# Patient Record
Sex: Female | Born: 1990 | Race: White | Hispanic: No | Marital: Married | Smoking: Former smoker
Health system: Southern US, Community
[De-identification: ages and names within clinical notes are randomized; demographics above are authoritative.]

## PROBLEM LIST (undated history)

## (undated) DIAGNOSIS — Z789 Other specified health status: Secondary | ICD-10-CM

## (undated) HISTORY — DX: Other specified health status: Z78.9

## (undated) HISTORY — PX: CHOLECYSTECTOMY: SHX55

---
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
## 2008-01-31 ENCOUNTER — Emergency Department: Payer: Self-pay | Admitting: Emergency Medicine

## 2008-02-07 ENCOUNTER — Emergency Department: Payer: Self-pay | Admitting: Emergency Medicine

## 2008-12-06 IMAGING — CT CT ABD-PELV W/ CM
1 of 2 series · 15 of 32 positions shown, 19 images · non-contrast
Comparison: none

REASON FOR EXAM: (1) rlq pain; (2) rlq pin
COMMENTS:

PROCEDURE:     CT  - CT ABDOMEN / PELVIS  W  - August 10, 2007  [DATE]
RESULT:     Comparison: Renal stone CT on 07/13/2007.
TECHNIQUE: CT examination of the abdomen and pelvis was performed after
intravenous administration of 85 ml of Ysovue-HRC nonionic contrast in
addition to oral contrast. Collimation is 3 mm.

[Series 2: appendicitis · axial · 0.76mm/px · z∈[-416,+16]mm · 15 of 158 slices shown, 19 images]
[im 7/158  soft-tissue]
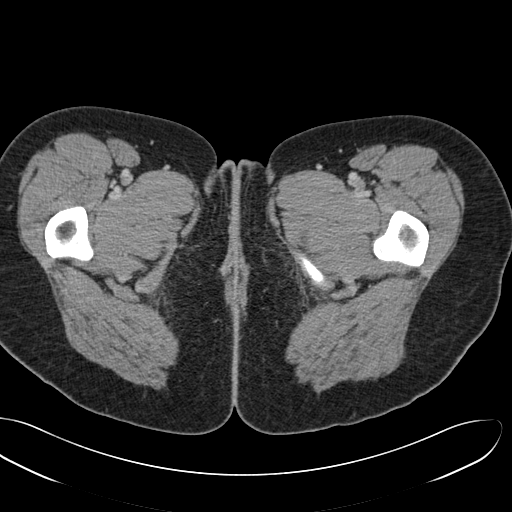
[im 7/158  bone]
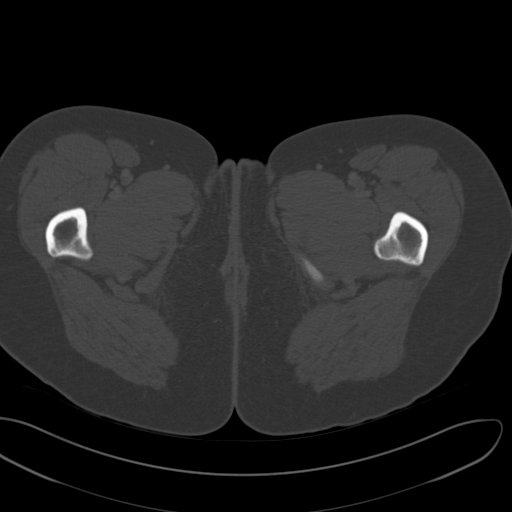
[im 19/158  soft-tissue]
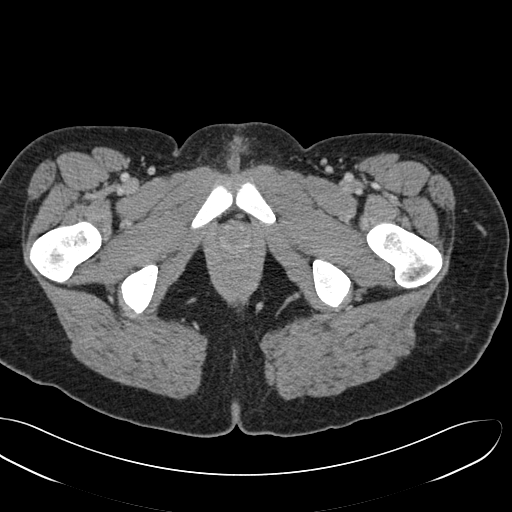
[im 32/158  soft-tissue]
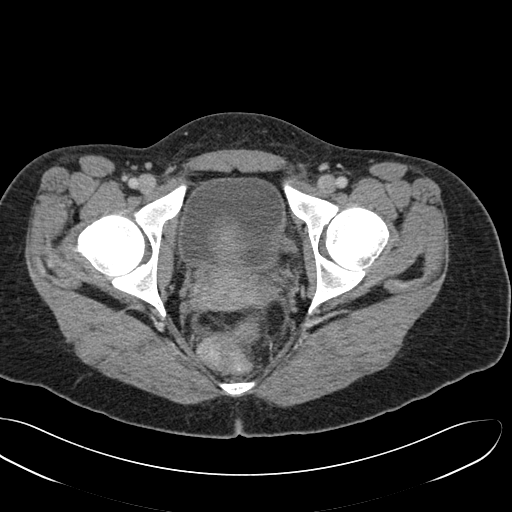
[im 44/158  soft-tissue]
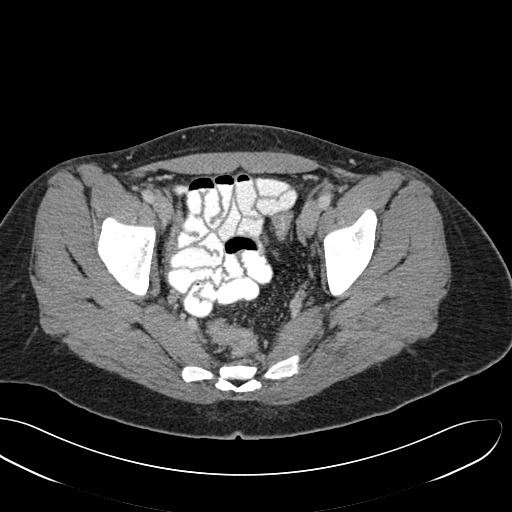
[im 57/158  soft-tissue]
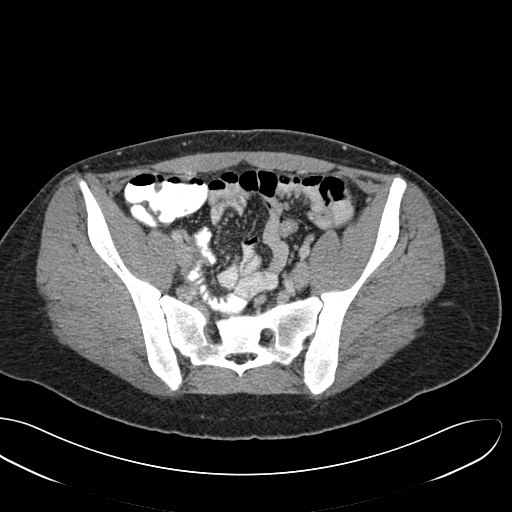
[im 70/158  soft-tissue]
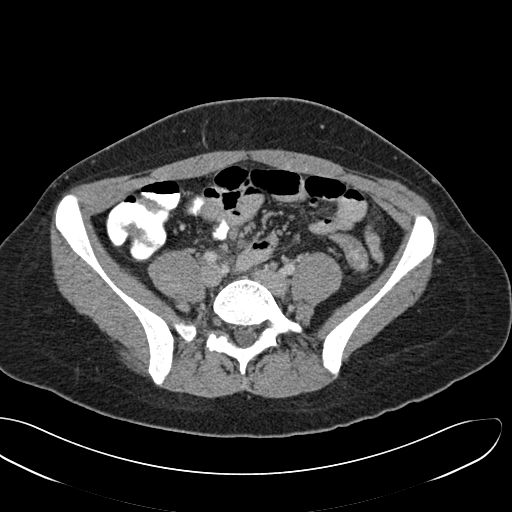
[im 82/158  soft-tissue]
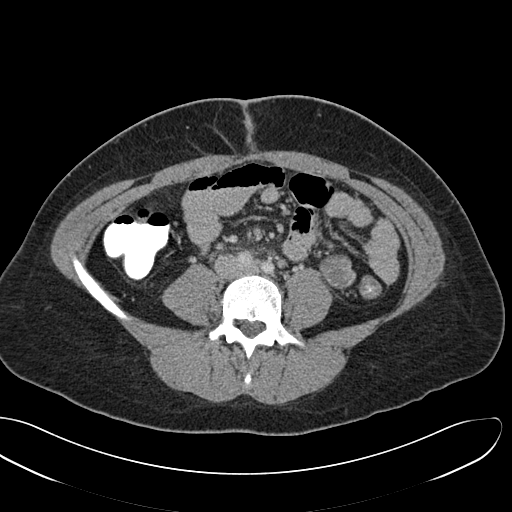
[im 88/158  soft-tissue]
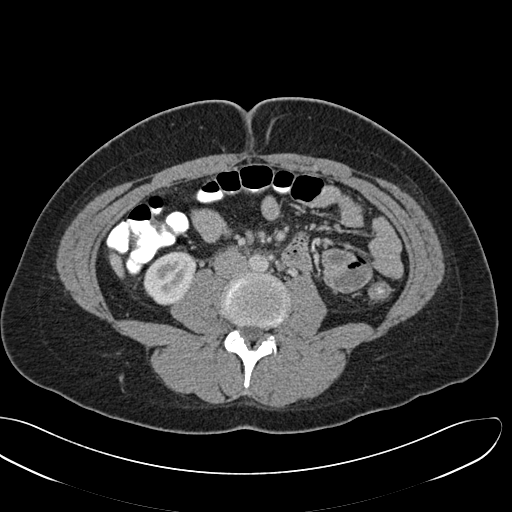
[im 101/158  soft-tissue]
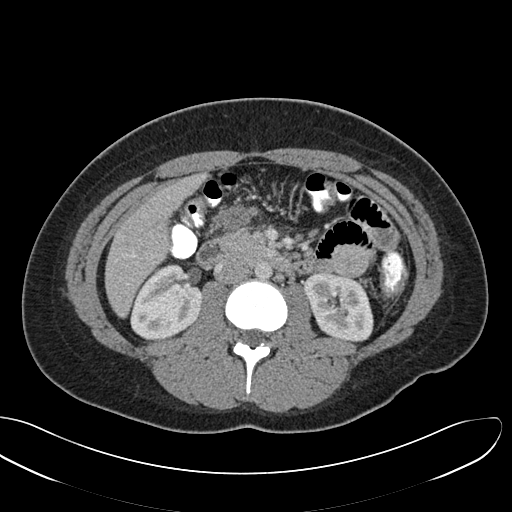
[im 101/158  bone]
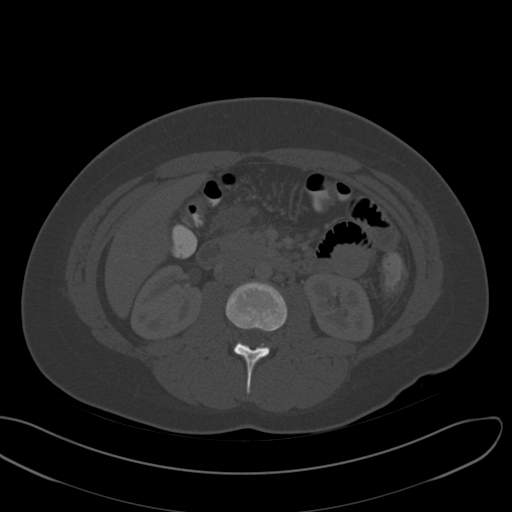
[im 114/158  soft-tissue]
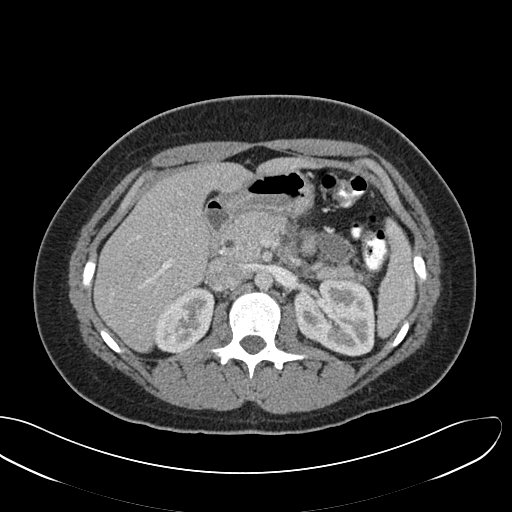
[im 126/158  soft-tissue]
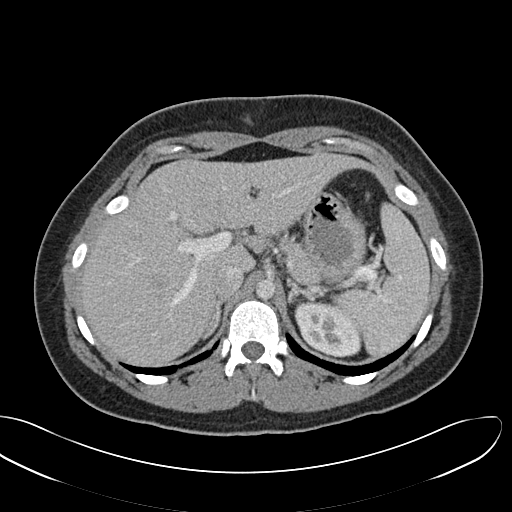
[im 132/158  lung]
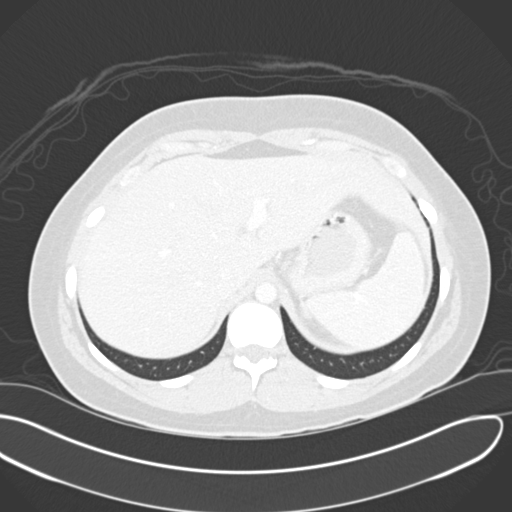
[im 139/158  soft-tissue]
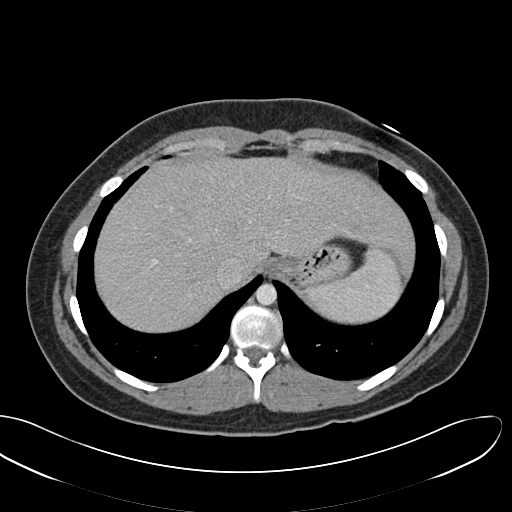
[im 139/158  lung]
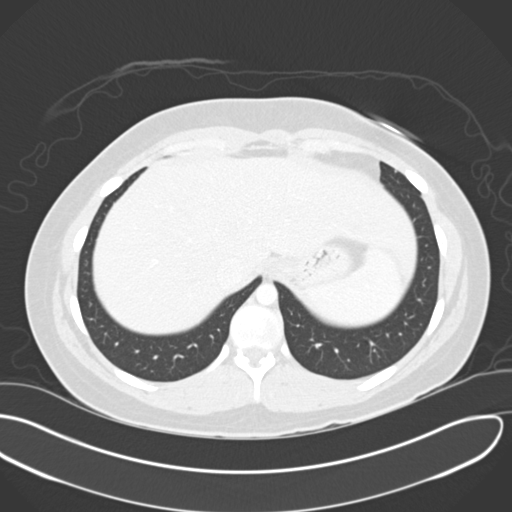
[im 145/158  lung]
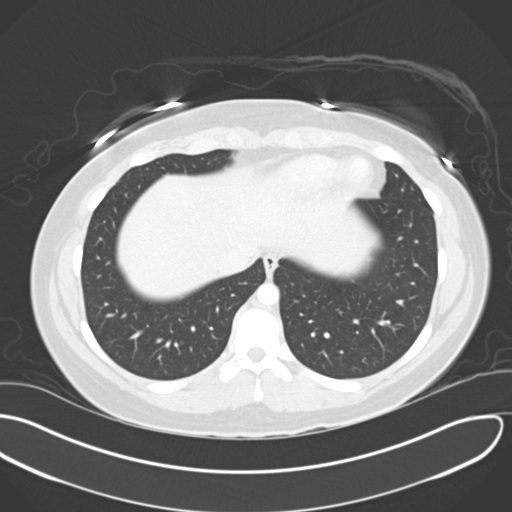
[im 151/158  soft-tissue]
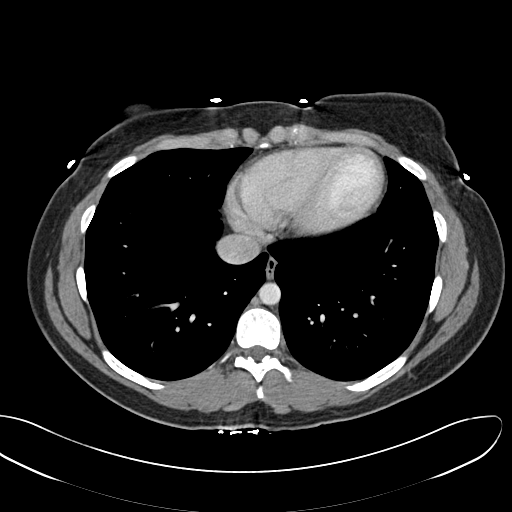
[im 151/158  lung]
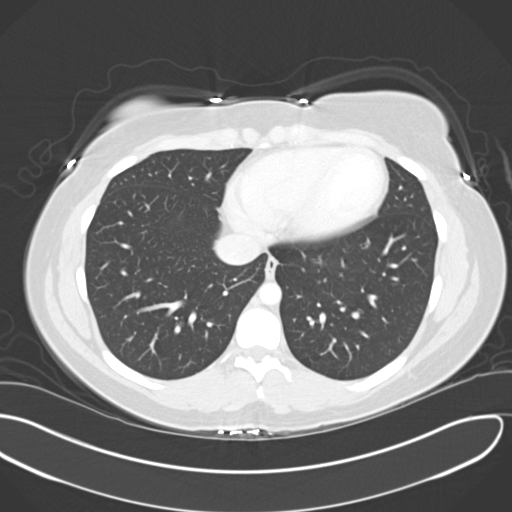

[15 of 32 positions shown; findings below may reference images not displayed]

FINDINGS: Limited evaluation of the lung bases is unremarkable.

Peripancreatic stranding is decreased compare to the previous exam. There is
no well-defined fluid collection to suggest a formed pseudocyst. The portal
and splenic veins are patent. There is no focal area of decreased pancreatic
enhancement to suggest pancreatic necrosis. The liver, spleen, adrenal
glands, and kidneys are unremarkable. Patient is status post
cholecystectomy.

There is no dilatation or definite wall thickening of the bowels. The
appendix is unremarkable. There is no intraperitoneal free air. There
minimal residual pelvic free fluid. There are no enlarged abdominal pelvic
lymph nodes. The uterus is present with fluid in the uterine cavity.
IMPRESSION: 1. Findings are again suggestive of sequela of pancreatitis with decreased
peripancreatic inflammation compare to prior.

## 2008-12-06 IMAGING — US US PELV - US TRANSVAGINAL
1 series · 17 of 25 positions shown · non-contrast
Comparison: none

REASON FOR EXAM: RLQ pain
COMMENTS:

[Series 1: us pelv - us transvaginal · 17 of 35 slices shown]
[im 1/35]
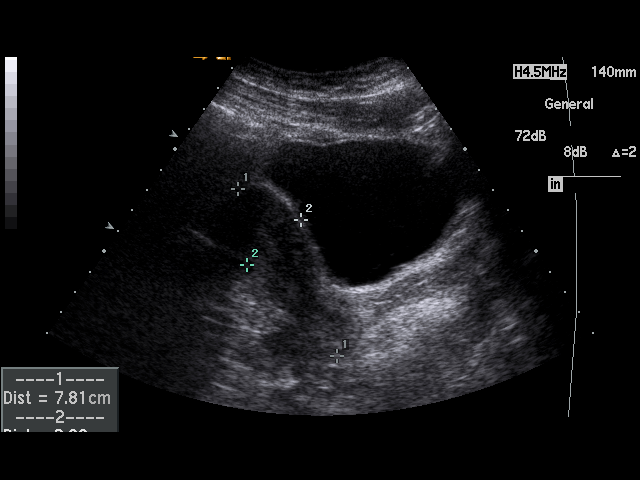
[im 3/35]
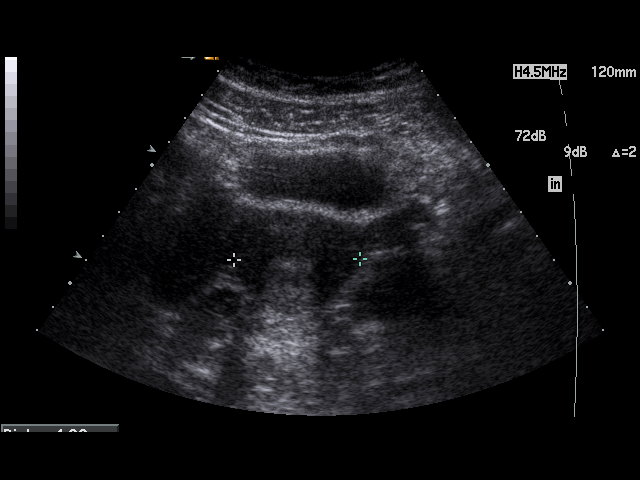
[im 5/35]
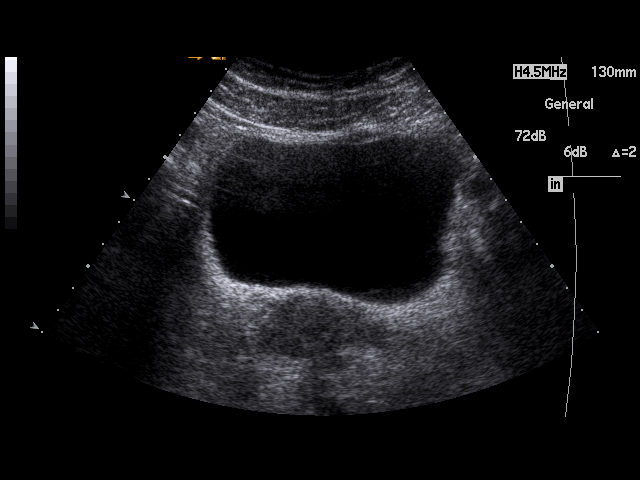
[im 8/35]
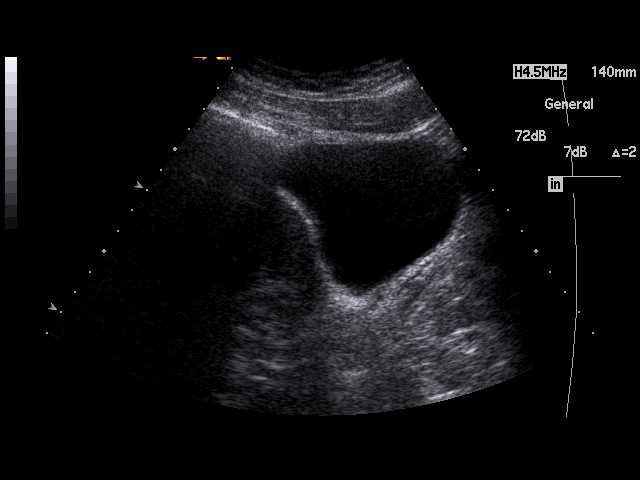
[im 9/35]
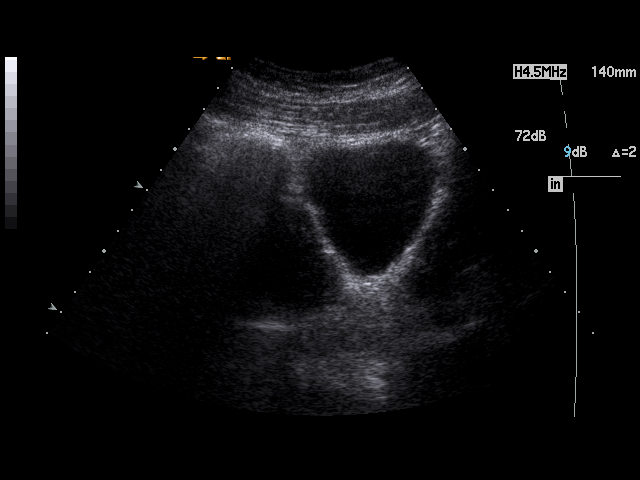
[im 12/35]
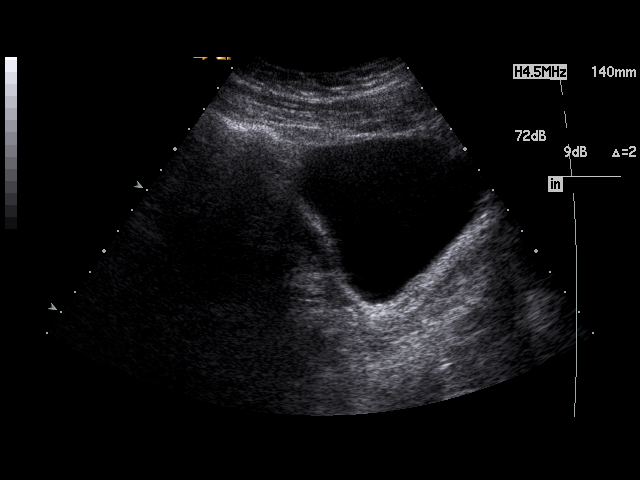
[im 13/35]
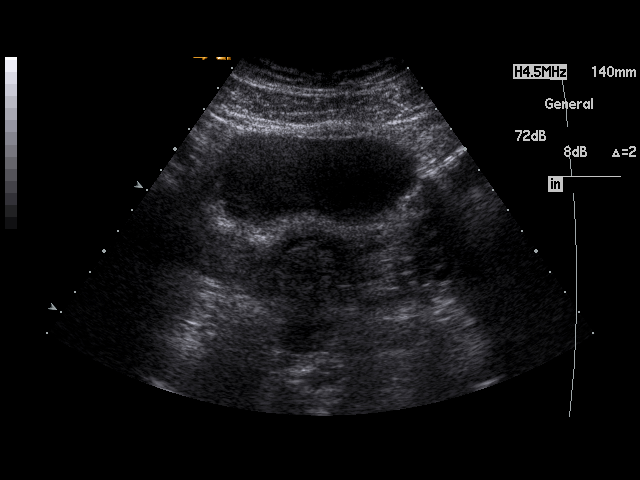
[im 16/35]
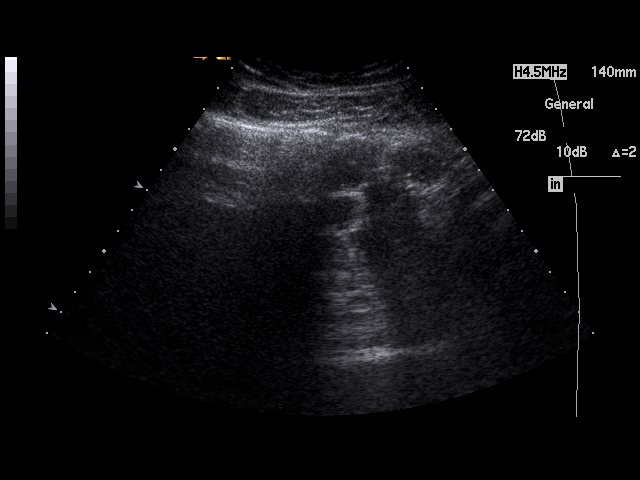
[im 18/35]
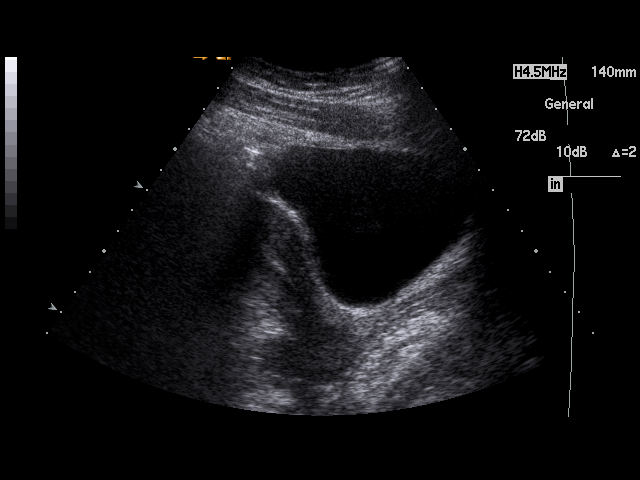
[im 19/35]
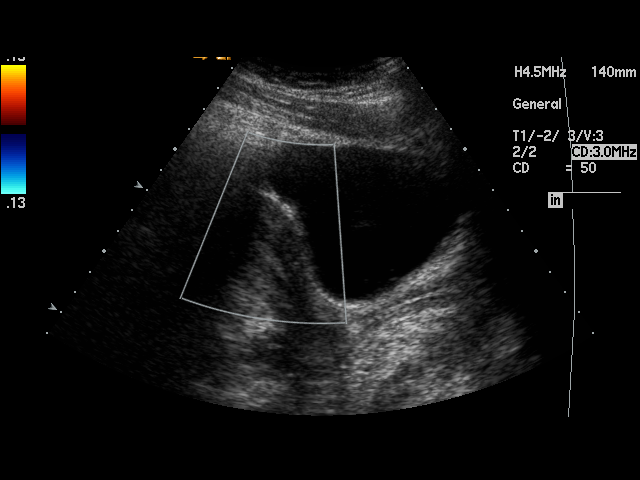
[im 22/35]
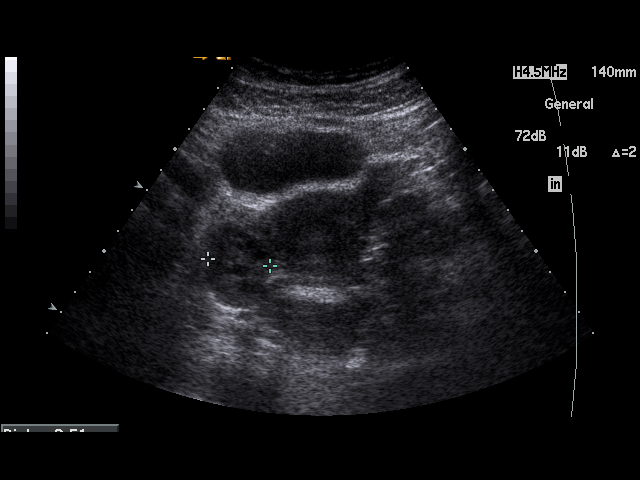
[im 23/35]
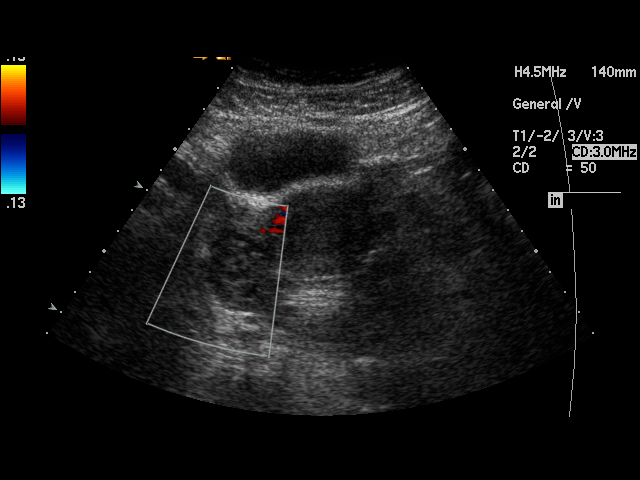
[im 26/35]
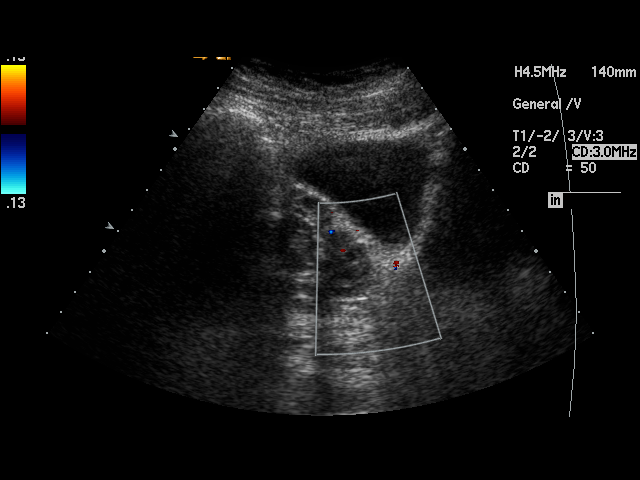
[im 27/35]
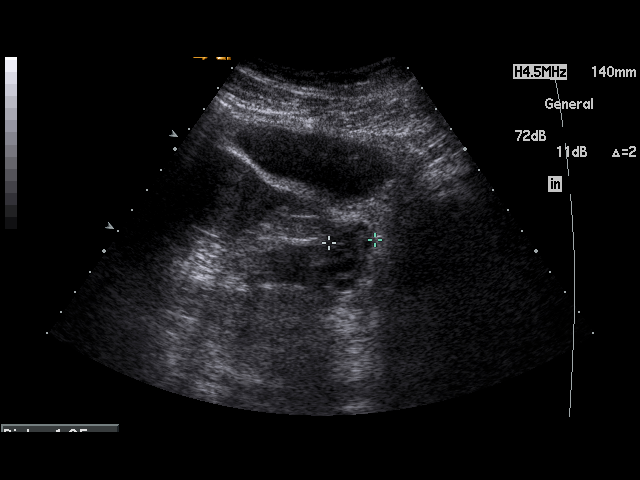
[im 30/35]
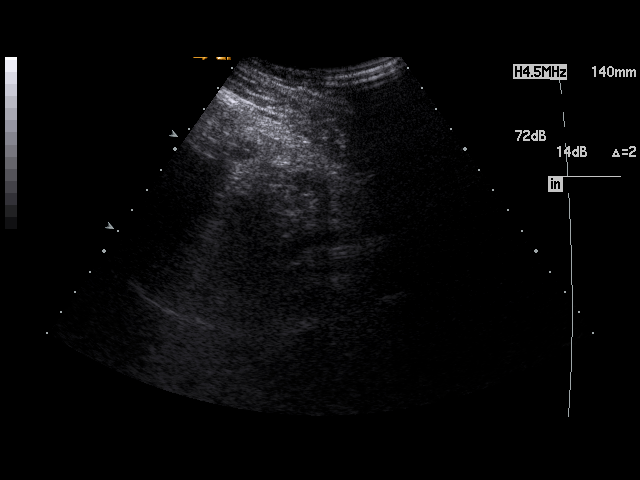
[im 32/35]
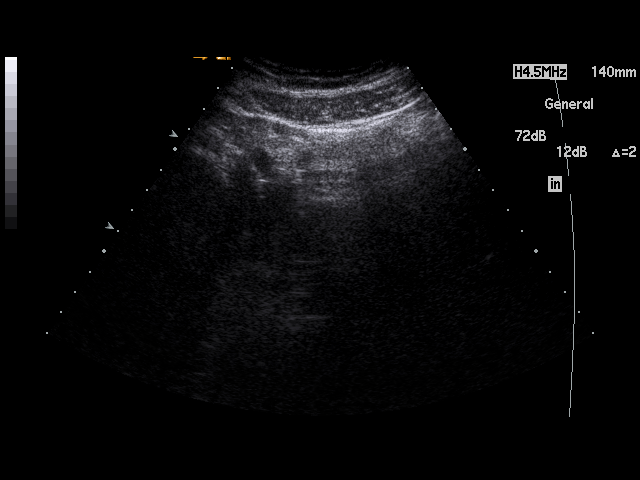
[im 35/35]
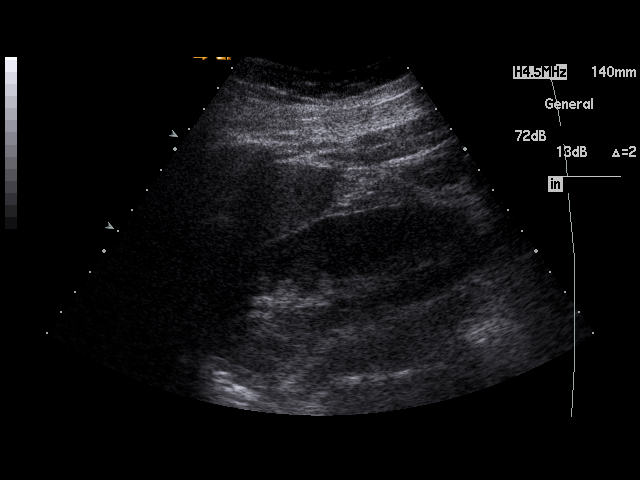

[17 of 25 positions shown; findings below may reference images not displayed]

PROCEDURE:     US  - US PELVIS MASS EXAM  - [DATE] [DATE] [DATE]  [DATE]

RESULT:     The size, shape and echotexture of the uterus are normal. The
adnexa are normal. No pathologic pelvic fluid collections are noted. There
is no hydronephrosis. The patient refused endovaginal exam. A negative
Pelvic Ultrasound does not exclude ectopic or early IUP.
IMPRESSION: Normal exam.

## 2010-12-09 ENCOUNTER — Emergency Department: Payer: Self-pay | Admitting: Emergency Medicine

## 2015-05-19 ENCOUNTER — Encounter: Payer: Self-pay | Admitting: Emergency Medicine

## 2015-05-19 ENCOUNTER — Emergency Department
Admission: EM | Admit: 2015-05-19 | Discharge: 2015-05-19 | Disposition: A | Discharge status: Home / Self Care | Payer: BC Managed Care – PPO | Attending: Emergency Medicine | Admitting: Emergency Medicine

## 2015-05-19 DIAGNOSIS — Z87891 Personal history of nicotine dependence: Secondary | ICD-10-CM | POA: Diagnosis not present

## 2015-05-19 DIAGNOSIS — R05 Cough: Secondary | ICD-10-CM | POA: Diagnosis present

## 2015-05-19 DIAGNOSIS — J069 Acute upper respiratory infection, unspecified: Secondary | ICD-10-CM | POA: Insufficient documentation

## 2015-05-19 MED ORDER — HYDROCOD POLST-CPM POLST ER 10-8 MG/5ML PO SUER: 5.0000 mL | Freq: Two times a day (BID) | ORAL | Status: DC | PRN

## 2015-05-19 MED ORDER — AMOXICILLIN 875 MG PO TABS: 875.0000 mg | ORAL_TABLET | Freq: Two times a day (BID) | ORAL | Status: DC

## 2015-05-19 NOTE — Discharge Instructions (Signed)
Upper Respiratory Infection, Adult Most upper respiratory infections (URIs) are a viral infection of the air passages leading to the lungs. A URI affects the nose, throat, and upper air passages. The most common type of URI is nasopharyngitis and is typically referred to as "the common cold." URIs run their course and usually go away on their own. Most of the time, a URI does not require medical attention, but sometimes a bacterial infection in the upper airways can follow a viral infection. This is called a secondary infection. Sinus and middle ear infections are common types of secondary upper respiratory infections. Bacterial pneumonia can also complicate a URI. A URI can worsen asthma and chronic obstructive pulmonary disease (COPD). Sometimes, these complications can require emergency medical care and may be life threatening.  CAUSES Almost all URIs are caused by viruses. A virus is a type of germ and can spread from one person to another.  RISKS FACTORS You may be at risk for a URI if:   You smoke.   You have chronic heart or lung disease.  You have a weakened defense (immune) system.   You are very young or very old.   You have nasal allergies or asthma.  You work in crowded or poorly ventilated areas.  You work in health care facilities or schools. SIGNS AND SYMPTOMS  Symptoms typically develop 2-3 days after you come in contact with a cold virus. Most viral URIs last 7-10 days. However, viral URIs from the influenza virus (flu virus) can last 14-18 days and are typically more severe. Symptoms may include:   Runny or stuffy (congested) nose.   Sneezing.   Cough.   Sore throat.   Headache.   Fatigue.   Fever.   Loss of appetite.   Pain in your forehead, behind your eyes, and over your cheekbones (sinus pain).  Muscle aches.  DIAGNOSIS  Your health care provider may diagnose a URI by:  Physical exam.  Tests to check that your symptoms are not due to  another condition such as:  Strep throat.  Sinusitis.  Pneumonia.  Asthma. TREATMENT  A URI goes away on its own with time. It cannot be cured with medicines, but medicines may be prescribed or recommended to relieve symptoms. Medicines may help:  Reduce your fever.  Reduce your cough.  Relieve nasal congestion. HOME CARE INSTRUCTIONS   Take medicines only as directed by your health care provider.   Gargle warm saltwater or take cough drops to comfort your throat as directed by your health care provider.  Use a warm mist humidifier or inhale steam from a shower to increase air moisture. This may make it easier to breathe.  Drink enough fluid to keep your urine clear or pale yellow.   Eat soups and other clear broths and maintain good nutrition.   Rest as needed.   Return to work when your temperature has returned to normal or as your health care provider advises. You may need to stay home longer to avoid infecting others. You can also use a face mask and careful hand washing to prevent spread of the virus.  Increase the usage of your inhaler if you have asthma.   Do not use any tobacco products, including cigarettes, chewing tobacco, or electronic cigarettes. If you need help quitting, ask your health care provider. PREVENTION  The best way to protect yourself from getting a cold is to practice good hygiene.   Avoid oral or hand contact with people with cold   symptoms.   Wash your hands often if contact occurs.  There is no clear evidence that vitamin C, vitamin E, echinacea, or exercise reduces the chance of developing a cold. However, it is always recommended to get plenty of rest, exercise, and practice good nutrition.  SEEK MEDICAL CARE IF:   You are getting worse rather than better.   Your symptoms are not controlled by medicine.   You have chills.  You have worsening shortness of breath.  You have brown or red mucus.  You have yellow or brown nasal  discharge.  You have pain in your face, especially when you bend forward.  You have a fever.  You have swollen neck glands.  You have pain while swallowing.  You have white areas in the back of your throat. SEEK IMMEDIATE MEDICAL CARE IF:   You have severe or persistent:  Headache.  Ear pain.  Sinus pain.  Chest pain.  You have chronic lung disease and any of the following:  Wheezing.  Prolonged cough.  Coughing up blood.  A change in your usual mucus.  You have a stiff neck.  You have changes in your:  Vision.  Hearing.  Thinking.  Mood. MAKE SURE YOU:   Understand these instructions.  Will watch your condition.  Will get help right away if you are not doing well or get worse.   This information is not intended to replace advice given to you by your health care provider. Make sure you discuss any questions you have with your health care provider.   Document Released: 10/22/2000 Document Revised: 09/12/2014 Document Reviewed: 08/03/2013 Elsevier Interactive Patient Education 2016 [employer]  

## 2015-05-19 NOTE — ED Provider Notes (Signed)
[facility] Emergency Department Provider Note  ____________________________________________  Time seen: Approximately 8:58 AM  I have reviewed the triage vital signs and the nursing notes.   HISTORY  Chief Complaint URI    HPI Pam Stout is a 25 y.o. female presents for evaluation of cold symptoms 2 weeks. Patient was initially treated with bronchitis for Z-Pak but states that she started again yesterday with cough congestion. Denies any fever.   History reviewed. No pertinent past medical history.  There are no active problems to display for this patient.   Past Surgical History  Procedure Laterality Date  . Cholecystectomy      Current Outpatient Rx  Name  Route  Sig  Dispense  Refill  . amoxicillin (AMOXIL) 875 MG tablet   Oral   Take 1 tablet (875 mg total) by mouth 2 (two) times daily.   20 tablet   0   . chlorpheniramine-HYDROcodone (TUSSIONEX PENNKINETIC ER) 10-8 MG/5ML SUER   Oral   Take 5 mLs by mouth every 12 (twelve) hours as needed for cough.   120 mL   0     Allergies Review of patient's allergies indicates no known allergies.  No family history on file.  Social History Social History  Substance Use Topics  . Smoking status: Former Games developer  . Smokeless tobacco: Never Used  . Alcohol Use: Yes     Comment: occas.    Review of Systems Constitutional: No fever/chills Eyes: No visual changes. ENT: No sore throat. Cardiovascular: Denies chest pain. Respiratory: Denies shortness of breath. Genitourinary: Negative for dysuria. Musculoskeletal: Negative for back pain. Skin: Negative for rash. Neurological: Negative for headaches, focal weakness or numbness.  10-point ROS otherwise negative.  ____________________________________________   PHYSICAL EXAM:  VITAL SIGNS: ED Triage Vitals  Enc Vitals Group     BP 05/19/15 0852 112/65 mmHg     Pulse Rate 05/19/15 0852 86     Resp 05/19/15 0852 16     Temp  05/19/15 0852 97.9 F (36.6 C)     Temp Source 05/19/15 0852 Oral     SpO2 05/19/15 0852 98 %     Weight 05/19/15 0852 200 lb (90.719 kg)     Height 05/19/15 0852 5\' 8"  (1.727 m)     Head Cir --      Peak Flow --      Pain Score --      Pain Loc --      Pain Edu? --      Excl. in GC? --    Constitutional: Alert and oriented. Well appearing and in no acute distress. Eyes: Conjunctivae are normal. PERRL. EOMI. Head: Atraumatic. Nose:Positive congestion/rhinnorhea, with swollen turbinates noted bilaterally.. Mouth/Throat: Mucous membranes are moist.  Oropharynx non-erythematous. Neck: No stridor.   Cardiovascular: Normal rate, regular rhythm. Grossly normal heart sounds.  Good peripheral circulation. Respiratory: Normal respiratory effort.  No retractions. Lungs CTAB. Musculoskeletal: No lower extremity tenderness nor edema.  No joint effusions. Neurologic:  Normal speech and language. No gross focal neurologic deficits are appreciated. No gait instability. Skin:  Skin is warm, dry and intact. No rash noted. Psychiatric: Mood and affect are normal. Speech and behavior are normal.  ____________________________________________   LABS (all labs ordered are listed, but only abnormal results are displayed)  Labs Reviewed - No data to display    PROCEDURES  Procedure(s) performed: None  Critical Care performed: No  ____________________________________________   INITIAL IMPRESSION / ASSESSMENT AND PLAN / ED COURSE  Pertinent labs & imaging results that were available during my care of the patient were reviewed by me and considered in my medical decision making (see chart for details).  Acute upper respiratory infection. Rx given for the next 5 cc twice a day, amoxicillin 875 twice a day. Patient follow-up PCP or return to the ER with any worsening symptomology. ____________________________________________   FINAL CLINICAL IMPRESSION(S) / ED DIAGNOSES  Final diagnoses:   URI, acute      Evangeline DakinCharles M Beers, PA-C 05/19/15 0914  Darien Ramusavid W Kaminski, MD 05/19/15 1525

## 2015-05-19 NOTE — ED Notes (Signed)
Cold symptoms 2 weeks ago. Treated for Bronchitis with Z pack. Yesterday pt started again with cough and congestion. Denies fever

## 2016-07-23 ENCOUNTER — Encounter (HOSPITAL_BASED_OUTPATIENT_CLINIC_OR_DEPARTMENT_OTHER): Payer: Self-pay

## 2016-07-23 ENCOUNTER — Emergency Department (HOSPITAL_BASED_OUTPATIENT_CLINIC_OR_DEPARTMENT_OTHER): Payer: BC Managed Care – PPO

## 2016-07-23 ENCOUNTER — Emergency Department (HOSPITAL_BASED_OUTPATIENT_CLINIC_OR_DEPARTMENT_OTHER)
Admission: EM | Admit: 2016-07-23 | Discharge: 2016-07-23 | Disposition: A | Discharge status: Home / Self Care | Payer: BC Managed Care – PPO | Attending: Emergency Medicine | Admitting: Emergency Medicine

## 2016-07-23 DIAGNOSIS — Z87891 Personal history of nicotine dependence: Secondary | ICD-10-CM | POA: Insufficient documentation

## 2016-07-23 DIAGNOSIS — M7662 Achilles tendinitis, left leg: Secondary | ICD-10-CM | POA: Diagnosis not present

## 2016-07-23 DIAGNOSIS — M79605 Pain in left leg: Secondary | ICD-10-CM | POA: Diagnosis present

## 2016-07-23 MED ORDER — IBUPROFEN 400 MG PO TABS
600.0000 mg | ORAL_TABLET | Freq: Once | ORAL | Status: AC
  Administered 2016-07-23: 600 mg via ORAL
  Filled 2016-07-23: qty 1

## 2016-07-23 MED ORDER — NAPROXEN 500 MG PO TABS: 500.0000 mg | ORAL_TABLET | Freq: Two times a day (BID) | ORAL | 0 refills | Status: DC

## 2016-07-23 NOTE — ED Provider Notes (Signed)
[facility] Provider Note   CSN: [id number] Arrival date & time: 07/23/16  1153     History   Chief Complaint Chief Complaint  Patient presents with  . Leg Pain    HPI Pam Stout is a 26 y.o. female.  HPI Pam Stout is a 26 y.o. female presents to emergency department complaining of pain to the left calf. 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 She reports that she started feeling pain approximately 2 weeks ago. Pain is worse with walking and moving her ankle. She states that pain is sharp, and states "feels like tearing sensation." She reports similar pain approximately a year ago, results in its own at that time. She denies any known injuries. She denies any redness or swelling to the area. She states that her leg does swell up by the end of the day. No history of blood clots. No fever, chills. No other complaints.  History reviewed. No pertinent past medical history.  There are no active problems to display for this patient.   Past Surgical History:  Procedure Laterality Date  . CHOLECYSTECTOMY      OB History    No data available       Home Medications    Prior to Admission medications   Not on File    Family History No family history on file.  Social History Social History  Substance Use Topics  . Smoking status: Former Games developer  . Smokeless tobacco: Never Used  . Alcohol use Yes     Comment: occ     Allergies   Patient has no known allergies.   Review of Systems Review of Systems  Constitutional: Negative for chills and fever.  Cardiovascular: Positive for leg swelling.  Musculoskeletal: Positive for arthralgias. Negative for joint swelling.  Neurological: Negative for weakness and numbness.  All other systems reviewed and are negative.    Physical Exam Updated Vital Signs BP 148/62 (BP Location: Right Arm)   Pulse 73   Temp 98.5 F (36.9 C) (Oral)   Resp 18   Ht 5\' 8"  (1.727 m)    Wt 99 kg   LMP 06/24/2016   SpO2 100%   BMI 33.18 kg/m   Physical Exam  Constitutional: She appears well-developed and well-nourished. No distress.  Eyes: Conjunctivae are normal.  Neck: Neck supple.  Musculoskeletal:  Normal appearing left leg. No obvious swelling, erythema, bruising. Achilles tendon is intact. Negative Thompson's test. Tenderness to palpation over posterior calf and Achilles tendon. Pain with ankle dorsiflexion and plantar flexion. Dorsal pedal pulses intact.  Neurological: She is alert.  Skin: Skin is warm and dry.  Nursing note and vitals reviewed.    ED Treatments / Results  Labs (all labs ordered are listed, but only abnormal results are displayed) Labs Reviewed - No data to display  EKG  EKG Interpretation None       Radiology US Venous Img Lower Unilateral Left  Result Date: 07/23/2016 CLINICAL DATA:  One week history of left calf region pain EXAM: LEFT LOWER EXTREMITY VENOUS DUPLEX ULTRASOUND TECHNIQUE: Gray-scale sonography with graded compression, as well as color Doppler and duplex ultrasound were performed to evaluate the left lower extremity deep venous system from the level of the common femoral vein and including the common femoral, femoral, profunda femoral, popliteal and calf veins including the posterior tibial, peroneal and gastrocnemius veins when visible. The superficial great saphenous vein was also interrogated. Spectral Doppler was utilized  to evaluate flow at rest and with distal augmentation maneuvers in the common femoral, femoral and popliteal veins. COMPARISON:  None. FINDINGS: Contralateral Common Femoral Vein: Respiratory phasicity is normal and symmetric with the symptomatic side. No evidence of thrombus. Normal compressibility. Common Femoral Vein: No evidence of thrombus. Normal compressibility, respiratory phasicity and response to augmentation. Saphenofemoral Junction: No evidence of thrombus. Normal compressibility and flow on  color Doppler imaging. Profunda Femoral Vein: No evidence of thrombus. Normal compressibility and flow on color Doppler imaging. Femoral Vein: No evidence of thrombus. Normal compressibility, respiratory phasicity and response to augmentation. Popliteal Vein: No evidence of thrombus. Normal compressibility, respiratory phasicity and response to augmentation. Calf Veins: No evidence of thrombus. Normal compressibility and flow on color Doppler imaging. Superficial Great Saphenous Vein: No evidence of thrombus. Normal compressibility and flow on color Doppler imaging. Venous Reflux:  None. Other Findings:  None. IMPRESSION: No evidence of left lower extremity deep venous thrombosis. Right common femoral vein also patent. Electronically Signed   By: Bretta BangWilliam  Woodruff III M.D.   On: 07/23/2016 14:57    Procedures Procedures (including critical care time)  Medications Ordered in ED Medications - No data to display   Initial Impression / Assessment and Plan / ED Course  I have reviewed the triage vital signs and the nursing notes.  Pertinent labs & imaging results that were available during my care of the patient were reviewed by me and considered in my medical decision making (see chart for details).    Patient with left calf and Achilles pain. No injuries. Achilles tendon is intact. Pain with ankle dorsiflexion and tenderness to palpation over the calf. Most likely muscular strain, Achilles tendinitis, versus partial tear, however unable to rule out DVT given her symptoms and reported leg swelling. We will get venous Doppler.    2:20 PM Patient does not want to wait for venous Doppler of her leg. She is aware that she may have a blood clot that could potentially be life-threatening, she understands, she would like to see if her pain and her symptoms would worsen. I will place her in a Cam Walker. NSAIDs for pain and inflammation. Follow-up with orthopedic specialist. Return precautions  discussed.  2:50 PM Patient changed her mind again and decided to have her venous Doppler.  Patient's venous Dopplers negative. She'll be discharged home with a Cam Walker. Most likely Achilles tendinitis. I will have her follow-up with sports medicine. Return precautions discussed.  Vitals:   07/23/16 1156 07/23/16 1157 07/23/16 1518  BP: 148/62  113/56  Pulse: 73  74  Resp: 18  16  Temp: 98.5 F (36.9 C)    TempSrc: Oral    SpO2: 100%  99%  Weight:  99 kg   Height:  5\' 8"  (1.727 m)      Final Clinical Impressions(s) / ED Diagnoses   Final diagnoses:  Tendonitis, Achilles, left    New Prescriptions New Prescriptions   No medications on file     Jaynie Crumbleatyana Cambelle Suchecki, PA-C 07/23/16 1617    Pricilla LovelessScott Goldston, MD 07/24/16 [phone]

## 2016-07-23 NOTE — ED Triage Notes (Signed)
Pt c/o pain to left ankle and calf x "couple weeks"-pain started while carrying approx 60 lb trays over her head up stairs at [employer]-steady gait

## 2016-07-23 NOTE — Discharge Instructions (Signed)
Cam walker for immobilization. Ice packs several times a day. Keep the leg elevated, ice.  and Naprosyn for pain and inflammation. Please follow-up as referred. Return if worsening symptoms.

## 2017-09-29 ENCOUNTER — Ambulatory Visit: Payer: Self-pay | Admitting: Obstetrics & Gynecology

## 2017-11-03 ENCOUNTER — Encounter: Payer: Self-pay | Admitting: Obstetrics and Gynecology

## 2017-11-03 ENCOUNTER — Ambulatory Visit (INDEPENDENT_AMBULATORY_CARE_PROVIDER_SITE_OTHER): Payer: BC Managed Care – PPO | Admitting: Obstetrics and Gynecology

## 2017-11-03 VITALS — BP 102/64 | HR 80 | Ht 68.0 in | Wt 234.0 lb

## 2017-11-03 DIAGNOSIS — Z01419 Encounter for gynecological examination (general) (routine) without abnormal findings: Secondary | ICD-10-CM

## 2017-11-03 DIAGNOSIS — Z113 Encounter for screening for infections with a predominantly sexual mode of transmission: Secondary | ICD-10-CM | POA: Diagnosis not present

## 2017-11-03 DIAGNOSIS — Z124 Encounter for screening for malignant neoplasm of cervix: Secondary | ICD-10-CM

## 2017-11-03 DIAGNOSIS — Z1331 Encounter for screening for depression: Secondary | ICD-10-CM

## 2017-11-03 DIAGNOSIS — Z1339 Encounter for screening examination for other mental health and behavioral disorders: Secondary | ICD-10-CM
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
## 2017-11-04 LAB — HIV ANTIBODY (ROUTINE TESTING W REFLEX): HIV SCREEN 4TH GENERATION: NONREACTIVE

## 2017-11-04 LAB — HEPATITIS C ANTIBODY: HEP C VIRUS AB: 0.2 {s_co_ratio} (ref 0.0–0.9)

## 2017-11-04 LAB — HEPATITIS B SURFACE ANTIBODY,QUALITATIVE: Hep B Surface Ab, Qual: NONREACTIVE

## 2017-11-04 LAB — RPR QUALITATIVE: RPR Ser Ql: NONREACTIVE

## 2017-11-09 LAB — IGP,CTNG,APTIMAHPV,RFX16/18,45
Chlamydia, Nuc. Acid Amp: NEGATIVE
GONOCOCCUS BY NUCLEIC ACID AMP: NEGATIVE
HPV Aptima: POSITIVE — AB
HPV GENOTYPE 16: POSITIVE — AB
HPV GENOTYPE 18,45: NEGATIVE
PAP SMEAR COMMENT: 0

## 2017-11-10 ENCOUNTER — Telehealth: Payer: Self-pay | Admitting: Obstetrics and Gynecology

## 2017-11-10 NOTE — Telephone Encounter (Signed)
Left generic VM 

## 2017-12-24 ENCOUNTER — Encounter: Payer: Self-pay | Admitting: Obstetrics and Gynecology

## 2018-04-16 ENCOUNTER — Ambulatory Visit: Payer: BC Managed Care – PPO | Admitting: Obstetrics and Gynecology

## 2018-05-18 ENCOUNTER — Other Ambulatory Visit (HOSPITAL_COMMUNITY)
Admission: RE | Admit: 2018-05-18 | Discharge: 2018-05-18 | Disposition: A | Discharge status: Home / Self Care | Payer: BC Managed Care – PPO | Source: Ambulatory Visit | Attending: Obstetrics and Gynecology | Admitting: Obstetrics and Gynecology

## 2018-05-18 ENCOUNTER — Ambulatory Visit (INDEPENDENT_AMBULATORY_CARE_PROVIDER_SITE_OTHER): Payer: BC Managed Care – PPO | Admitting: Obstetrics and Gynecology

## 2018-05-18 ENCOUNTER — Encounter: Payer: Self-pay | Admitting: Obstetrics and Gynecology

## 2018-05-18 VITALS — BP 108/70 | Ht 68.0 in | Wt 232.0 lb

## 2018-05-18 DIAGNOSIS — Z23 Encounter for immunization: Secondary | ICD-10-CM | POA: Diagnosis not present

## 2018-05-18 DIAGNOSIS — N870 Mild cervical dysplasia: Secondary | ICD-10-CM

## 2018-05-18 DIAGNOSIS — R8789 Other abnormal findings in specimens from female genital organs: Secondary | ICD-10-CM | POA: Insufficient documentation

## 2018-05-18 DIAGNOSIS — R87618 Other abnormal cytological findings on specimens from cervix uteri: Secondary | ICD-10-CM | POA: Insufficient documentation

## 2018-05-18 NOTE — Progress Notes (Signed)
HPI:  Pam Stout is a 28 y.o.  G0P0000  who presents today for evaluation and management of abnormal cervical cytology.    Dysplasia History:  NILM, HPV 16 positive   OB History  Gravida Para Term Preterm AB Living  0 0 0 0 0 0  SAB TAB Ectopic Multiple Live Births  0 0 0 0 0    Past Medical History:  Diagnosis Date  . No known health problems     Past Surgical History:  Procedure Laterality Date  . CHOLECYSTECTOMY      SOCIAL HISTORY:  Social History   Substance and Sexual Activity  Alcohol Use Yes   Comment: occ    Social History   Substance and Sexual Activity  Drug Use No     Family History  Problem Relation Age of Onset  . Diabetes Maternal Grandmother   . Stroke Maternal Grandmother   . Diabetes Maternal Grandfather   . Stroke Father   . Diabetes Maternal Aunt     ALLERGIES:  Patient has no known allergies.  Current Outpatient Medications on File Prior to Visit  Medication Sig Dispense Refill  . cholecalciferol (VITAMIN D) 1000 units tablet Take 1 tablet by mouth daily.    Marland Kitchen. ibuprofen (ADVIL,MOTRIN) 800 MG tablet Take 800 mg by mouth every 8 (eight) hours as needed.    . Misc Natural Products (CORTISOL PO) Take 1 tablet by mouth daily.    . Multiple Vitamin (MULTI-VITAMINS) TABS Take 1 tablet by mouth daily.    . Nutritional Supplements (GLUCOSE MANAGEMENT) TABS Take by mouth.     No current facility-administered medications on file prior to visit.     Physical Exam: -Vitals:  BP 108/70   Ht 5\' 8"  (1.727 m)   Wt 232 lb (105.2 kg)   BMI 35.28 kg/m  GEN: WD, WN, NAD.  A+ O x 3, good mood and affect. ABD:  NT, ND.  Soft, no masses.  No hernias noted.   Pelvic:   Vulva: Normal appearance.  No lesions.  Vagina: No lesions or abnormalities noted.  Support: Normal pelvic support.  Urethra No masses tenderness or scarring.  Meatus Normal size without lesions or prolapse.  Cervix: See below.  Anus: Normal exam.  No lesions.    Perineum: Normal exam.  No lesions.        Bimanual   Uterus: Normal size.  Non-tender.  Mobile.  AV.  Adnexae: No masses.  Non-tender to palpation.  Cul-de-sac: Negative for abnormality.   PROCEDURE: 1.  Urine Pregnancy Test:  not done (same sex relationship) 2.  Colposcopy performed with 4% acetic acid after verbal consent obtained                                         -Aceto-white Lesions Location(s): mildly, diffusely               -Biopsy performed at 4 and 8 o'clock               -ECC indicated and performed: Yes.       -Biopsy sites made hemostatic with pressure and/or Monsel's solution   -Satisfactory colposcopy: No.    -Evidence of Invasive cervical CA :  NO  ASSESSMENT:  Pam Stout is a 28 y.o. G0P0000 here for  1. Pap smear abnormality of cervix/human papillomavirus (HPV) positive   2. Other  abnormal cytological finding of specimen from cervix   .  PLAN:  I discussed the grading system of pap smears and HPV high risk viral types.  We will discuss and base management after colpo results return.  Plans for second shot of Gardasil today     Thomasene MohairStephen Cheyenna Pankowski, MD  [facility], [facility] 05/18/2018  3:01 PM

## 2018-05-18 NOTE — Addendum Note (Signed)
Addended by: Desmond Dike on: 05/18/2018 03:14 PM   Modules accepted: Orders

## 2018-06-04 ENCOUNTER — Telehealth: Payer: Self-pay | Admitting: Obstetrics and Gynecology

## 2018-06-04 NOTE — Telephone Encounter (Signed)
Left generic message with her wife to let her know to call me to discuss results.  The patient's number is [phone] and she will be available after 830 tonight (1/24). If I am unable to call, I asked that Canary call the clinic Monday when she had the chance and leave a good time and number for me to call.

## 2018-06-10 ENCOUNTER — Telehealth: Payer: Self-pay | Admitting: Obstetrics and Gynecology

## 2018-06-10 NOTE — Telephone Encounter (Signed)
Long discussion regarding findings on pathology (single aggregate of highly atypical glandular cells).  Discussed that this type of finding is hard to further investigate because of the location of these cells.  I recommend at a minimum a LEEP procedure, but overall, I recommend a cold knife conization procedure to give me the best opportunity to remove the abnormal cells and get a good tissue sample. Discussed the relative benefits of doing either procedure along with the danger of doing nothing, which would be the development of outright cancer of the cervix. She voiced understanding and would like to think about it. She will call me back early next week once she's had a chance to think it over.  All questions answered for now.  Thomasene Mohair, MD, [facility], [facility] 06/10/2018 3:15 PM

## 2018-10-28 ENCOUNTER — Ambulatory Visit: Payer: BC Managed Care – PPO | Admitting: Obstetrics and Gynecology

## 2019-09-05 ENCOUNTER — Encounter: Payer: Self-pay | Admitting: Emergency Medicine

## 2019-09-05 ENCOUNTER — Emergency Department
Admission: EM | Admit: 2019-09-05 | Discharge: 2019-09-05 | Disposition: A | Discharge status: Home / Self Care | Payer: BC Managed Care – PPO | Attending: Emergency Medicine | Admitting: Emergency Medicine

## 2019-09-05 ENCOUNTER — Emergency Department: Payer: BC Managed Care – PPO

## 2019-09-05 ENCOUNTER — Other Ambulatory Visit: Payer: Self-pay

## 2019-09-05 DIAGNOSIS — Z87891 Personal history of nicotine dependence: Secondary | ICD-10-CM | POA: Diagnosis not present

## 2019-09-05 DIAGNOSIS — S29011A Strain of muscle and tendon of front wall of thorax, initial encounter: Secondary | ICD-10-CM | POA: Diagnosis not present

## 2019-09-05 DIAGNOSIS — Y999 Unspecified external cause status: Secondary | ICD-10-CM | POA: Insufficient documentation

## 2019-09-05 DIAGNOSIS — Z79899 Other long term (current) drug therapy: Secondary | ICD-10-CM | POA: Diagnosis not present

## 2019-09-05 DIAGNOSIS — X500XXA Overexertion from strenuous movement or load, initial encounter: Secondary | ICD-10-CM | POA: Diagnosis not present

## 2019-09-05 DIAGNOSIS — Y93B3 Activity, free weights: Secondary | ICD-10-CM | POA: Diagnosis not present

## 2019-09-05 DIAGNOSIS — S299XXA Unspecified injury of thorax, initial encounter: Secondary | ICD-10-CM | POA: Diagnosis present

## 2019-09-05 DIAGNOSIS — Y929 Unspecified place or not applicable: Secondary | ICD-10-CM | POA: Insufficient documentation

## 2019-09-05 LAB — CBC
HCT: 43.1 % (ref 36.0–46.0)
Hemoglobin: 15 g/dL (ref 12.0–15.0)
MCH: 30.1 pg (ref 26.0–34.0)
MCHC: 34.8 g/dL (ref 30.0–36.0)
MCV: 86.4 fL (ref 80.0–100.0)
Platelets: 409 10*3/uL — ABNORMAL HIGH (ref 150–400)
RBC: 4.99 MIL/uL (ref 3.87–5.11)
RDW: 12.6 % (ref 11.5–15.5)
WBC: 14.5 10*3/uL — ABNORMAL HIGH (ref 4.0–10.5)
nRBC: 0 % (ref 0.0–0.2)

## 2019-09-05 LAB — TROPONIN I (HIGH SENSITIVITY): Troponin I (High Sensitivity): 4 ng/L (ref <18)

## 2019-09-05 LAB — BASIC METABOLIC PANEL
Anion gap: 10 (ref 5–15)
BUN: 16 mg/dL (ref 6–20)
CO2: 22 mmol/L (ref 22–32)
Calcium: 9.2 mg/dL (ref 8.9–10.3)
Chloride: 102 mmol/L (ref 98–111)
Creatinine, Ser: 0.97 mg/dL (ref 0.44–1.00)
GFR calc Af Amer: >60 mL/min (ref >60)
GFR calc non Af Amer: >60 mL/min (ref >60)
Glucose, Bld: 120 mg/dL — ABNORMAL HIGH (ref 70–99)
Potassium: 3.6 mmol/L (ref 3.5–5.1)
Sodium: 134 mmol/L — ABNORMAL LOW (ref 135–145)

## 2019-09-05 MED ORDER — HYDROXYZINE HCL 25 MG PO TABS: 25.0000 mg | ORAL_TABLET | Freq: Every evening | ORAL | 0 refills | Status: AC | PRN
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
## 2019-09-05 NOTE — ED Triage Notes (Signed)
First nurse note- here for chest and shoulder pain.  Pulled for EKG.  No injury.

## 2019-09-05 NOTE — ED Triage Notes (Signed)
Patient reports she has been having pain in the middle of her chest all day. States she feels like she is having a panic attack. History of same, but no home meds taken. States she has had a lot of increased stress lately.  Patient also reports pain in left should for 10 days. No known injury.
